# Patient Record
Sex: Male | Born: 1970 | Race: Black or African American | Hispanic: No | Marital: Married | Smoking: Never smoker
Health system: Southern US, Community
[De-identification: ages and names within clinical notes are randomized; demographics above are authoritative.]

## PROBLEM LIST (undated history)

## (undated) DIAGNOSIS — K4090 Unilateral inguinal hernia, without obstruction or gangrene, not specified as recurrent: Secondary | ICD-10-CM

## (undated) DIAGNOSIS — I10 Essential (primary) hypertension: Secondary | ICD-10-CM

---

## 1998-04-26 ENCOUNTER — Emergency Department (HOSPITAL_COMMUNITY): Admission: EM | Admit: 1998-04-26 | Discharge: 1998-04-26 | Payer: Self-pay | Admitting: Internal Medicine

## 1998-04-26 ENCOUNTER — Emergency Department (HOSPITAL_COMMUNITY): Admission: EM | Admit: 1998-04-26 | Discharge: 1998-04-26 | Payer: Self-pay | Admitting: Emergency Medicine

## 1998-04-26 ENCOUNTER — Encounter: Payer: Self-pay | Admitting: Emergency Medicine

## 1998-04-28 ENCOUNTER — Emergency Department (HOSPITAL_COMMUNITY): Admission: EM | Admit: 1998-04-28 | Discharge: 1998-04-28 | Payer: Self-pay

## 2010-03-19 ENCOUNTER — Emergency Department (HOSPITAL_BASED_OUTPATIENT_CLINIC_OR_DEPARTMENT_OTHER): Admission: EM | Admit: 2010-03-19 | Discharge: 2010-03-19 | Payer: Self-pay | Admitting: Emergency Medicine

## 2011-10-17 ENCOUNTER — Ambulatory Visit: Payer: Self-pay

## 2011-10-17 ENCOUNTER — Ambulatory Visit: Payer: Self-pay | Admitting: Emergency Medicine

## 2011-10-17 VITALS — BP 105/72 | HR 70 | Temp 98.8°F | Resp 16 | Ht 71.0 in | Wt 214.0 lb

## 2011-10-17 DIAGNOSIS — M25449 Effusion, unspecified hand: Secondary | ICD-10-CM

## 2011-10-17 DIAGNOSIS — M79646 Pain in unspecified finger(s): Secondary | ICD-10-CM

## 2011-10-17 DIAGNOSIS — S61209A Unspecified open wound of unspecified finger without damage to nail, initial encounter: Secondary | ICD-10-CM

## 2011-10-17 DIAGNOSIS — M79609 Pain in unspecified limb: Secondary | ICD-10-CM

## 2011-10-17 DIAGNOSIS — M79643 Pain in unspecified hand: Secondary | ICD-10-CM

## 2011-10-17 MED ORDER — CEFTRIAXONE SODIUM 1 G IJ SOLR
1.0000 g | Freq: Once | INTRAMUSCULAR | Status: AC
  Administered 2011-10-17: 1 g via INTRAMUSCULAR

## 2011-10-17 MED ORDER — DOXYCYCLINE HYCLATE 100 MG PO TABS: 100.0000 mg | ORAL_TABLET | Freq: Two times a day (BID) | ORAL | Status: AC

## 2011-10-17 NOTE — Progress Notes (Signed)
  Subjective:    Patient ID: Larry Carroll, male    DOB: 05-Dec-1970, 41 y.o.   MRN: [id number]  HPI p denies any trauma to the area. atient enters with a 2 day history of pain and swelling in his right ring finger he tried to mash on the area and since then has developed increasing redness and swelling. He denies trauma to the area.    Review of Systems noncontributory to     Objective:   Physical Exam physical exam reveals swelling involving the entire right index finger extending over the MCP joint. There is a pustule noted over the extensor surface proximal phalanx of the ring finger. This. A material noted beneath the pustule. The swelling extends over the MCP joint to the dorsum of the hand. He has very limited flexion and extension of that finger.  [facility] reading (PRIMARY) by  Dr.Slayter Moorhouse x-ray shows no fracture no acute problems       Assessment & Plan:  Patient is a well of the ring finger right hand. The I&D the area sent for culture give 1 g Rocephin. 48 hours treat doxy 100 twice a day recheck 48 hours

## 2011-10-17 NOTE — Progress Notes (Signed)
   Patient ID: Kevis Qu MRN: [id number], DOB: 1970/09/29, 41 y.o. Date of Encounter: 10/17/2011, 11:37 AM    PROCEDURE NOTE: Verbal consent obtained. Betadine prep per usual protocol. Lesion unroofed with 20 gauge needle  Culture taken. Small amount of purulence expressed. Lesion explored revealing no loculations. Irrigated with [employer]. Wound care instructions including precautions with patient. Patient tolerated the procedure well. Recheck pending labs     Signed, Eula Listen, Cordelia Poche 10/17/2011 11:37 AM

## 2011-10-18 ENCOUNTER — Ambulatory Visit: Payer: Self-pay | Admitting: Emergency Medicine

## 2011-10-18 ENCOUNTER — Encounter: Payer: Self-pay | Admitting: Emergency Medicine

## 2011-10-18 VITALS — BP 116/74 | HR 76 | Temp 98.2°F | Resp 16

## 2011-10-18 DIAGNOSIS — L03019 Cellulitis of unspecified finger: Secondary | ICD-10-CM

## 2011-10-18 NOTE — Progress Notes (Signed)
  Subjective:    Patient ID: Larry Carroll, male    DOB: 11-Aug-1970, 41 y.o.   MRN: [id number]  HPI patient in for wound check of his ring  finger. He denies any fever or chills but he has been taking some ibuprofen. He did note some spontaneous drainage today of his open site on the back of his finger. He has had swelling of his hand extending down to the wrist.    Review of Systems patient has no other ongoing medical problems.     Objective:   Physical Exam there is fusiform swelling of the entire finger. He is able to flex at the PIP joint without discomfort. But he has limited range of motion here. The site is open and draining droplets of purulent material.Swelling extends over the dorsum of the hand which extends down to the wrist.        Assessment & Plan:  Assessment is abscess of the right ring finger. We'll go ahead and have a loose shoulder and of the area more to express more purulent material. We'll go ahead and wrap to give him some compression. He is to clean the area with soap and water 3 or 4 times a day. Otherwise he is to keep the hand elevated

## 2011-10-18 NOTE — Progress Notes (Signed)
[facility]  Marcaine injected locally at base of lesion 2 cc.  Area cleansed.  Pressure applied and purulent drainage was expressed.  Small exudative material removed from central wound.  Peeling tissue debrided. Cleansed and dressed. Pt tolerated this well.

## 2011-10-19 ENCOUNTER — Ambulatory Visit (INDEPENDENT_AMBULATORY_CARE_PROVIDER_SITE_OTHER): Payer: Self-pay | Admitting: Emergency Medicine

## 2011-10-19 VITALS — BP 124/73 | HR 69 | Temp 98.1°F | Resp 16 | Ht 71.0 in | Wt 214.0 lb

## 2011-10-19 DIAGNOSIS — S61209A Unspecified open wound of unspecified finger without damage to nail, initial encounter: Secondary | ICD-10-CM

## 2011-10-19 NOTE — Progress Notes (Signed)
  Subjective:    Patient ID: Larry Carroll, male    DOB: 10/05/1970, 41 y.o.   MRN: [id number]  HPI patient here for wound check. He is doing well. He's had decreased pain and swelling over the I&D site.    Review of Systems     Objective:   Physical Exam physical exam reveals a pustule present over the extensor portion of the proximal phalanx. There is swelling over the finger and dorsum of the hand. He has decreased range of motion at the PIP joint and MCP joints. This is significantly better than when seen yesterday.        Assessment & Plan:  Abscess finger improved on antibiotics. Cultures still pending negative to date we'll recheck 1 week continue doxy for now.

## 2011-10-20 LAB — WOUND CULTURE
Gram Stain: NONE SEEN
Gram Stain: NONE SEEN
Gram Stain: NONE SEEN

## 2011-10-21 LAB — WOUND CULTURE

## 2011-10-27 ENCOUNTER — Ambulatory Visit (INDEPENDENT_AMBULATORY_CARE_PROVIDER_SITE_OTHER): Payer: Self-pay | Admitting: Emergency Medicine

## 2011-10-27 VITALS — BP 106/73 | HR 59 | Temp 97.7°F | Resp 16 | Ht 71.5 in | Wt 214.0 lb

## 2011-10-27 DIAGNOSIS — S61209A Unspecified open wound of unspecified finger without damage to nail, initial encounter: Secondary | ICD-10-CM

## 2011-10-27 DIAGNOSIS — L0291 Cutaneous abscess, unspecified: Secondary | ICD-10-CM

## 2011-10-27 NOTE — Progress Notes (Signed)
  Subjective:    Patient ID: Larry Carroll, male    DOB: 01/24/1971, 41 y.o.   MRN: [id number]  HPI patient here to followup abscess with cellulitis of his hand. He has completed his antibiotics and he feels fine. He has no redness no swelling. He has full range of motion of his finger.    Review of Systems     Objective:   Physical Exam The finger has healed. He has no redness no swelling no evidence of infection.       Assessment & Plan:  Cellulitis of finger resolved. No further treatment necessary .

## 2013-04-13 IMAGING — CR DG FINGER RING 2+V*R*
1 series · 1 of 1 positions shown · non-contrast
Comparison: None.

CLINICAL DATA: Swelling.  No history of trauma provided.

RIGHT RING FINGER 2+V

[PA]
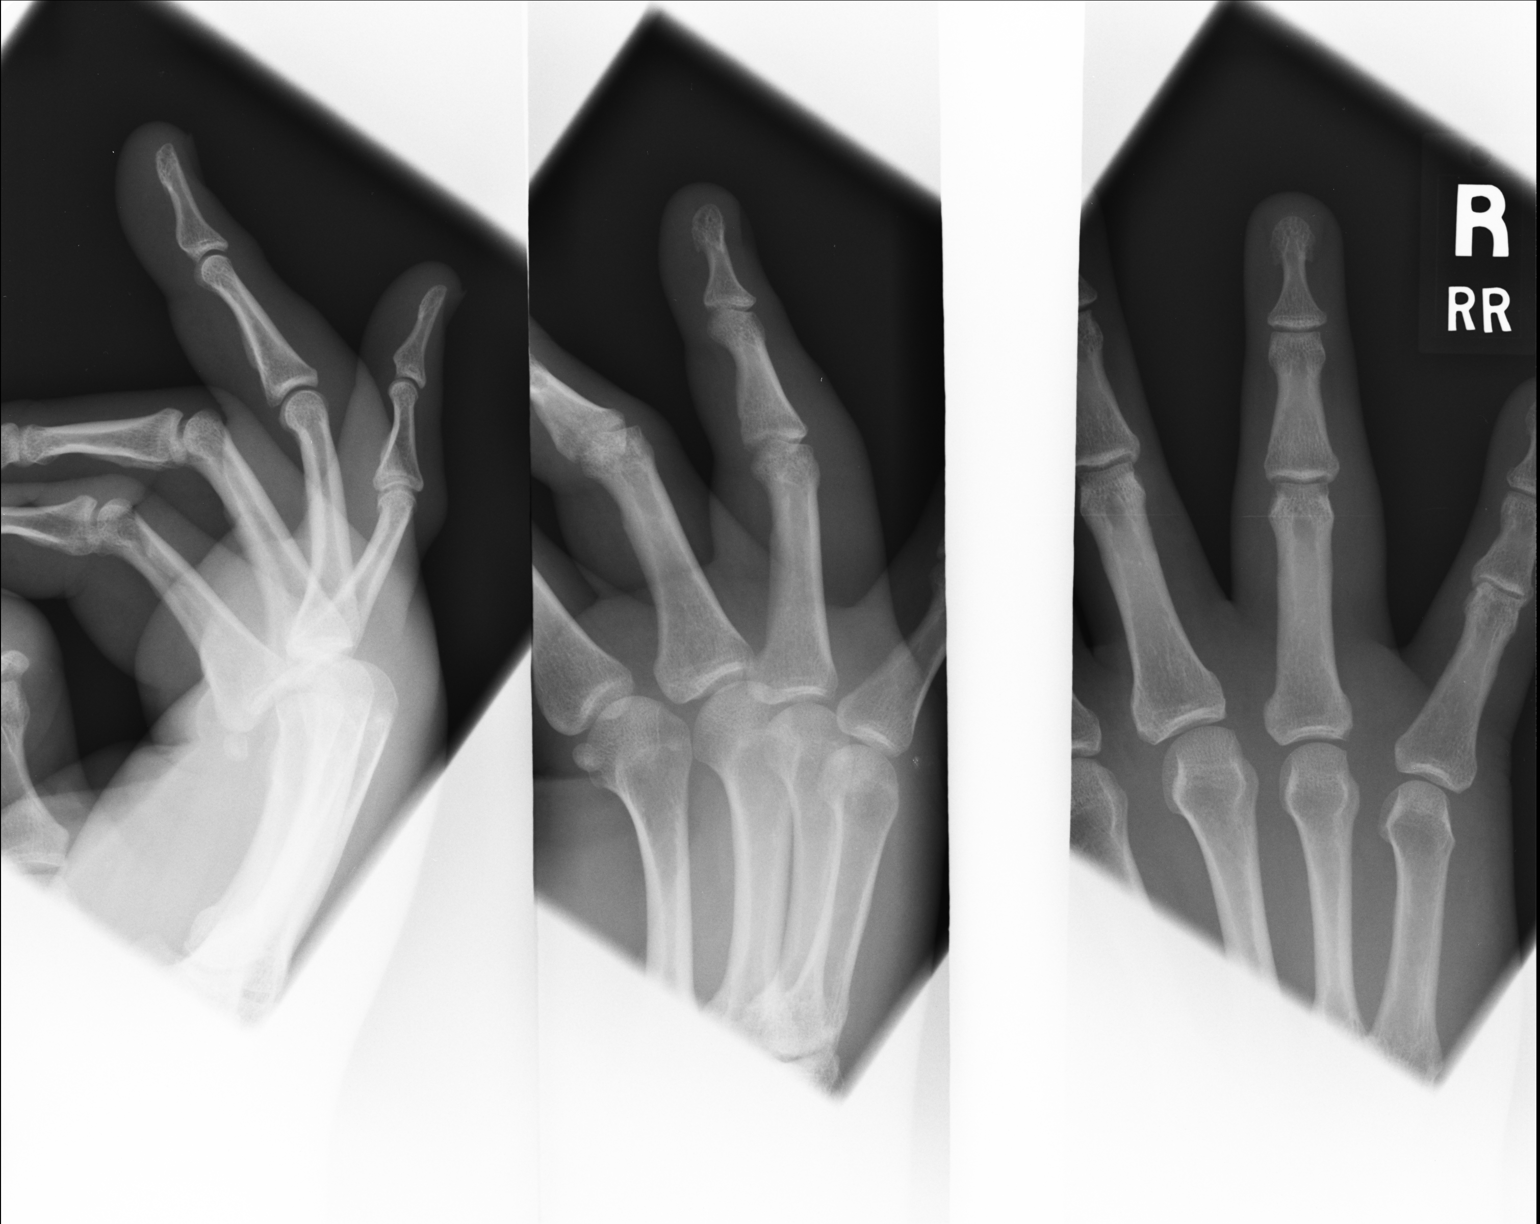

[1 of 1 positions shown; findings below may reference images not displayed]

FINDINGS: No fracture or dislocation.  No bony erosion.  Soft
tissue prominence.
IMPRESSION: No osseous abnormality.

Soft tissue prominence.

Clinically significant discrepancy from primary report, if
provided: None

## 2015-06-08 ENCOUNTER — Ambulatory Visit (INDEPENDENT_AMBULATORY_CARE_PROVIDER_SITE_OTHER): Payer: Self-pay | Admitting: Emergency Medicine

## 2015-06-08 VITALS — BP 138/88 | HR 68 | Temp 97.9°F | Resp 18 | Ht 71.0 in | Wt 237.0 lb

## 2015-06-08 DIAGNOSIS — K4090 Unilateral inguinal hernia, without obstruction or gangrene, not specified as recurrent: Secondary | ICD-10-CM

## 2015-06-08 NOTE — Patient Instructions (Signed)
Hernia, Adult A hernia is the bulging of an organ or tissue through a weak spot in the muscles of the abdomen (abdominal wall). Hernias develop most often near the navel or groin. There are many kinds of hernias. Common kinds include:  Femoral hernia. This kind of hernia develops under the groin in the upper thigh area.  Inguinal hernia. This kind of hernia develops in the groin or scrotum.  Umbilical hernia. This kind of hernia develops near the navel.  Hiatal hernia. This kind of hernia causes part of the stomach to be pushed up into the chest.  Incisional hernia. This kind of hernia bulges through a scar from an abdominal surgery. CAUSES This condition may be caused by:  Heavy lifting.  Coughing over a long period of time.  Straining to have a bowel movement.  An incision made during an abdominal surgery.  A birth defect (congenital defect).  Excess weight or obesity.  Smoking.  Poor nutrition.  Cystic fibrosis.  Excess fluid in the abdomen.  Undescended testicles. SYMPTOMS Symptoms of a hernia include:  A lump on the abdomen. This is the first sign of a hernia. The lump may become more obvious with standing, straining, or coughing. It may get bigger over time if it is not treated or if the condition causing it is not treated.  Pain. A hernia is usually painless, but it may become painful over time if treatment is delayed. The pain is usually dull and may get worse with standing or lifting heavy objects. Sometimes a hernia gets tightly squeezed in the weak spot (strangulated) or stuck there (incarcerated) and causes additional symptoms. These symptoms may include:  Vomiting.  Nausea.  Constipation.  Irritability. DIAGNOSIS A hernia may be diagnosed with:  A physical exam. During the exam your health care provider may ask you to cough or to make a specific movement, because a hernia is usually more visible when you move.  Imaging tests. These can  include:  X-rays.  Ultrasound.  CT scan. TREATMENT A hernia that is small and painless may not need to be treated. A hernia that is large or painful may be treated with surgery. Inguinal hernias may be treated with surgery to prevent incarceration or strangulation. Strangulated hernias are always treated with surgery, because lack of blood to the trapped organ or tissue can cause it to die. Surgery to treat a hernia involves pushing the bulge back into place and repairing the weak part of the abdomen. HOME CARE INSTRUCTIONS  Avoid straining.  Do not lift anything heavier than 10 lb (4.5 kg).  Lift with your leg muscles, not your back muscles. This helps avoid strain.  When coughing, try to cough gently.  Prevent constipation. Constipation leads to straining with bowel movements, which can make a hernia worse or cause a hernia repair to break down. You can prevent constipation by:  Eating a high-fiber diet that includes plenty of fruits and vegetables.  Drinking enough fluids to keep your urine clear or pale yellow. Aim to drink 6-8 glasses of water per day.  Using a stool softener as directed by your health care provider.  Lose weight, if you are overweight.  Do not use any tobacco products, including cigarettes, chewing tobacco, or electronic cigarettes. If you need help quitting, ask your health care provider.  Keep all follow-up visits as directed by your health care provider. This is important. Your health care provider may need to monitor your condition. SEEK MEDICAL CARE IF:  You have   swelling, redness, and pain in the affected area.  Your bowel habits change. SEEK IMMEDIATE MEDICAL CARE IF:  You have a fever.  You have abdominal pain that is getting worse.  You feel nauseous or you vomit.  You cannot push the hernia back in place by gently pressing on it while you are lying down.  The hernia:  Changes in shape or size.  Is stuck outside the  abdomen.  Becomes discolored.  Feels hard or tender.   This information is not intended to replace advice given to you by your health care provider. Make sure you discuss any questions you have with your health care provider.   Document Released: 06/11/2005 Document Revised: 07/02/2014 Document Reviewed: 04/21/2014 Elsevier Interactive Patient Education 2016 [employer]  

## 2015-06-08 NOTE — Progress Notes (Signed)
Subjective:  Patient ID: Larry Carroll, male    DOB: October 20, 1970  Age: 44 y.o. MRN: [id number]  CC: Swelling in inguinal area   HPI Larry RelicRonnie Carroll presents  with recent onset swelling in his right groin. He said these over the last week or 2 he's developed some intermittent pain in his right groin he said the one on Web M.D. and found that he thinks he has a hernia. History of injury. And has no job requiring a lot of lifting or straining. Has no nausea vomiting or stool change.  History Larry Carroll has no past medical history on file.   He has no past surgical history on file.   His  family history is not on file.  He   reports that he has never smoked. He does not have any smokeless tobacco history on file. His alcohol and drug histories are not on file.  Outpatient Prescriptions Prior to Visit  Medication Sig Dispense Refill  . ibuprofen (ADVIL,MOTRIN) 400 MG tablet Take 400 mg by mouth every 6 (six) hours as needed.     No facility-administered medications prior to visit.    Social History   Social History  . Marital Status: Married    Spouse Name: N/A  . Number of Children: N/A  . Years of Education: N/A   Social History Main Topics  . Smoking status: Never Smoker   . Smokeless tobacco: None  . Alcohol Use: None  . Drug Use: None  . Sexual Activity: Not Asked   Other Topics Concern  . None   Social History Narrative     Review of Systems  Constitutional: Negative for fever, chills and appetite change.  HENT: Negative for congestion, ear pain, postnasal drip, sinus pressure and sore throat.   Eyes: Negative for pain and redness.  Respiratory: Negative for cough, shortness of breath and wheezing.   Cardiovascular: Negative for leg swelling.  Gastrointestinal: Negative for nausea, vomiting, abdominal pain, diarrhea, constipation and blood in stool.  Endocrine: Negative for polyuria.  Genitourinary: Negative for dysuria, urgency, frequency and flank pain.    Musculoskeletal: Negative for gait problem.  Skin: Negative for rash.  Neurological: Negative for weakness and headaches.  Psychiatric/Behavioral: Negative for confusion and decreased concentration. The patient is not nervous/anxious.     Objective:  BP 138/88 mmHg  Pulse 68  Temp(Src) 97.9 F (36.6 C) (Oral)  Resp 18  Ht 5\' 11"  (1.803 m)  Wt 237 lb (107.502 kg)  BMI 33.07 kg/m2  SpO2 98%  Physical Exam  Constitutional: He is oriented to person, place, and time. He appears well-developed and well-nourished.  HENT:  Head: Normocephalic and atraumatic.  Eyes: Conjunctivae are normal. Pupils are equal, round, and reactive to light.  Pulmonary/Chest: Effort normal.  Abdominal: A hernia is present. Hernia confirmed positive in the right inguinal area.  Genitourinary: Testes normal and penis normal. Cremasteric reflex is present. Circumcised.  Musculoskeletal: He exhibits no edema.  Neurological: He is alert and oriented to person, place, and time.  Skin: Skin is dry.  Psychiatric: He has a normal mood and affect. His behavior is normal. Thought content normal.      Assessment & Plan:   Larry Carroll was seen today for swelling in inguinal area.  Diagnoses and all orders for this visit:  Unilateral inguinal hernia without obstruction or gangrene, recurrence not specified -     Ambulatory referral to General Surgery  I have discontinued Larry Carroll's ibuprofen.  No orders of the defined types were  placed in this encounter.    Appropriate red flag conditions were discussed with the patient as well as actions that should be taken.  Patient expressed his understanding.  Follow-up: Return if symptoms worsen or fail to improve.  Larry Dane, MD

## 2019-01-22 ENCOUNTER — Telehealth: Payer: Self-pay | Admitting: Family Medicine

## 2019-01-22 NOTE — Telephone Encounter (Signed)
Called pt LVM for him to call us back and schedule NP appt for fever

## 2022-07-21 ENCOUNTER — Emergency Department (HOSPITAL_BASED_OUTPATIENT_CLINIC_OR_DEPARTMENT_OTHER): Payer: BLUE CROSS/BLUE SHIELD

## 2022-07-21 ENCOUNTER — Encounter (HOSPITAL_BASED_OUTPATIENT_CLINIC_OR_DEPARTMENT_OTHER): Payer: Self-pay

## 2022-07-21 DIAGNOSIS — K4020 Bilateral inguinal hernia, without obstruction or gangrene, not specified as recurrent: Secondary | ICD-10-CM | POA: Insufficient documentation

## 2022-07-21 DIAGNOSIS — R102 Pelvic and perineal pain: Secondary | ICD-10-CM | POA: Diagnosis present

## 2022-07-21 LAB — LIPASE, BLOOD: Lipase: 43 U/L (ref 11–51)

## 2022-07-21 LAB — COMPREHENSIVE METABOLIC PANEL
ALT: 35 U/L (ref 0–44)
AST: 30 U/L (ref 15–41)
Albumin: 4.3 g/dL (ref 3.5–5.0)
Alkaline Phosphatase: 54 U/L (ref 38–126)
Anion gap: 8 (ref 5–15)
BUN: 12 mg/dL (ref 6–20)
CO2: 27 mmol/L (ref 22–32)
Calcium: 9.3 mg/dL (ref 8.9–10.3)
Chloride: 102 mmol/L (ref 98–111)
Creatinine, Ser: 1.16 mg/dL (ref 0.61–1.24)
GFR, Estimated: >60 mL/min (ref >60)
Glucose, Bld: 107 mg/dL — ABNORMAL HIGH (ref 70–99)
Potassium: 3.8 mmol/L (ref 3.5–5.1)
Sodium: 137 mmol/L (ref 135–145)
Total Bilirubin: 1.3 mg/dL — ABNORMAL HIGH (ref 0.3–1.2)
Total Protein: 8.5 g/dL — ABNORMAL HIGH (ref 6.5–8.1)

## 2022-07-21 LAB — URINALYSIS, MICROSCOPIC (REFLEX)

## 2022-07-21 LAB — URINALYSIS, ROUTINE W REFLEX MICROSCOPIC
Bilirubin Urine: NEGATIVE
Glucose, UA: NEGATIVE mg/dL
Ketones, ur: NEGATIVE mg/dL
Leukocytes,Ua: NEGATIVE
Nitrite: NEGATIVE
Protein, ur: NEGATIVE mg/dL
Specific Gravity, Urine: 1.02 (ref 1.005–1.030)
pH: 7 (ref 5.0–8.0)

## 2022-07-21 LAB — CBC
HCT: 44.4 % (ref 39.0–52.0)
Hemoglobin: 14.8 g/dL (ref 13.0–17.0)
MCH: 29.5 pg (ref 26.0–34.0)
MCHC: 33.3 g/dL (ref 30.0–36.0)
MCV: 88.6 fL (ref 80.0–100.0)
Platelets: 242 10*3/uL (ref 150–400)
RBC: 5.01 MIL/uL (ref 4.22–5.81)
RDW: 13.2 % (ref 11.5–15.5)
WBC: 8.1 10*3/uL (ref 4.0–10.5)
nRBC: 0 % (ref 0.0–0.2)

## 2022-07-21 MED ORDER — OXYCODONE-ACETAMINOPHEN 5-325 MG PO TABS
1.0000 | ORAL_TABLET | ORAL | Status: DC | PRN
  Administered 2022-07-21: 1 via ORAL
  Filled 2022-07-21: qty 1

## 2022-07-21 NOTE — ED Triage Notes (Addendum)
Pt reports a dx inguinal hernia on the RT that is larger today and painful. Pt states he normally wears his hernia belt, but it is not helping today. Pt states the hernia feels very hard. Pt denies urinary sx. Pt worried about strangulation of hernia.

## 2022-07-22 ENCOUNTER — Emergency Department (HOSPITAL_BASED_OUTPATIENT_CLINIC_OR_DEPARTMENT_OTHER)
Admission: EM | Admit: 2022-07-22 | Discharge: 2022-07-22 | Disposition: A | Discharge status: Home / Self Care | Payer: BLUE CROSS/BLUE SHIELD | Attending: Emergency Medicine | Admitting: Emergency Medicine

## 2022-07-22 ENCOUNTER — Other Ambulatory Visit: Payer: Self-pay

## 2022-07-22 DIAGNOSIS — K4020 Bilateral inguinal hernia, without obstruction or gangrene, not specified as recurrent: Secondary | ICD-10-CM

## 2022-07-22 HISTORY — DX: Essential (primary) hypertension: I10

## 2022-07-22 HISTORY — DX: Unilateral inguinal hernia, without obstruction or gangrene, not specified as recurrent: K40.90

## 2022-07-22 MED ORDER — DICLOFENAC SODIUM ER 100 MG PO TB24: 100.0000 mg | ORAL_TABLET | Freq: Every day | ORAL | 0 refills | Status: AC

## 2022-07-22 MED ORDER — KETOROLAC TROMETHAMINE 60 MG/2ML IM SOLN
30.0000 mg | Freq: Once | INTRAMUSCULAR | Status: AC
  Administered 2022-07-22: 30 mg via INTRAMUSCULAR
  Filled 2022-07-22: qty 2

## 2022-07-22 MED ORDER — DICLOFENAC SODIUM ER 100 MG PO TB24: 100.0000 mg | ORAL_TABLET | Freq: Every day | ORAL | 0 refills | Status: DC

## 2022-07-22 NOTE — ED Provider Notes (Signed)
[facility] Provider Note   CSN: [id number] Arrival date & time: 07/21/22  1812     History  Chief Complaint  Patient presents with   Pelvic Pain    Larry Carroll is a 52 y.o. male.  The history is provided by the patient.  Pelvic Pain This is a recurrent problem. The current episode started 6 to 12 hours ago. The problem occurs constantly. Pertinent negatives include no chest pain, no headaches and no shortness of breath. Nothing aggravates the symptoms. Nothing relieves the symptoms. He has tried nothing for the symptoms. The treatment provided no relief.  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  Patient knows he has hernias and usually wears a belt that helps but it did not help.       Home Medications Prior to Admission medications   Medication Sig Start Date End Date Taking? Authorizing Provider  Diclofenac Sodium CR 100 MG 24 hr tablet Take 1 tablet (100 mg total) by mouth daily. 07/22/22  Yes Yoltzin Ransom, MD      Allergies    Patient has no known allergies.    Review of Systems   Review of Systems  Constitutional:  Negative for fever.  HENT:  Negative for ear pain.   Eyes:  Negative for redness.  Respiratory:  Negative for shortness of breath.   Cardiovascular:  Negative for chest pain.  Genitourinary:  Positive for pelvic pain.       Groin/pelvic pain   Neurological:  Negative for headaches.  All other systems reviewed and are negative.   Physical Exam Updated Vital Signs BP (!) 143/103 (BP Location: Left Arm)   Pulse 77   Temp 98.5 F (36.9 C) (Oral)   Resp 18   Ht 5\' 11"  (1.803 m)   Wt 99.8 kg   SpO2 99%   BMI 30.68 kg/m  Physical Exam Vitals and nursing note reviewed.  Constitutional:      General: He is not in acute distress.    Appearance: Normal appearance. He is well-developed. He is not diaphoretic.  HENT:     Head: Normocephalic  and atraumatic.     Right Ear: Tympanic membrane normal.     Left Ear: Tympanic membrane normal.     Nose: No congestion.  Eyes:     Conjunctiva/sclera: Conjunctivae normal.     Pupils: Pupils are equal, round, and reactive to light.  Cardiovascular:     Rate and Rhythm: Normal rate and regular rhythm.     Pulses: Normal pulses.     Heart sounds: Normal heart sounds.  Pulmonary:     Effort: Pulmonary effort is normal.     Breath sounds: Normal breath sounds. No wheezing or rales.  Abdominal:     General: Bowel sounds are normal.     Palpations: Abdomen is soft. There is no mass.     Tenderness: There is no abdominal tenderness. There is no guarding or rebound.  Musculoskeletal:        General: Normal range of motion.     Cervical back: Normal range of motion and neck supple.  Skin:    General: Skin is warm and dry.     Capillary Refill: Capillary refill takes less than 2 seconds.  Neurological:     General: No focal deficit present.     Mental Status: He is alert and oriented to person, place, and time.  Deep Tendon Reflexes: Reflexes normal.  Psychiatric:        Mood and Affect: Mood normal.        Behavior: Behavior normal.     ED Results / Procedures / Treatments   Labs (all labs ordered are listed, but only abnormal results are displayed) Results for orders placed or performed during the hospital encounter of 07/22/22  Lipase, blood  Result Value Ref Range   Lipase 43 11 - 51 U/L  Comprehensive metabolic panel  Result Value Ref Range   Sodium 137 135 - 145 mmol/L   Potassium 3.8 3.5 - 5.1 mmol/L   Chloride 102 98 - 111 mmol/L   CO2 27 22 - 32 mmol/L   Glucose, Bld 107 (H) 70 - 99 mg/dL   BUN 12 6 - 20 mg/dL   Creatinine, Ser 5.40 0.61 - 1.24 mg/dL   Calcium 9.3 8.9 - 98.1 mg/dL   Total Protein 8.5 (H) 6.5 - 8.1 g/dL   Albumin 4.3 3.5 - 5.0 g/dL   AST 30 15 - 41 U/L   ALT 35 0 - 44 U/L   Alkaline Phosphatase 54 38 - 126 U/L   Total Bilirubin 1.3 (H) 0.3 -  1.2 mg/dL   GFR, Estimated >19 >14 mL/min   Anion gap 8 5 - 15  CBC  Result Value Ref Range   WBC 8.1 4.0 - 10.5 K/uL   RBC 5.01 4.22 - 5.81 MIL/uL   Hemoglobin 14.8 13.0 - 17.0 g/dL   HCT 78.2 95.6 - 21.3 %   MCV 88.6 80.0 - 100.0 fL   MCH 29.5 26.0 - 34.0 pg   MCHC 33.3 30.0 - 36.0 g/dL   RDW 08.6 57.8 - 46.9 %   Platelets 242 150 - 400 K/uL   nRBC 0.0 0.0 - 0.2 %  Urinalysis, Routine w reflex microscopic -Urine, Clean Catch  Result Value Ref Range   Color, Urine YELLOW YELLOW   APPearance CLEAR CLEAR   Specific Gravity, Urine 1.020 1.005 - 1.030   pH 7.0 5.0 - 8.0   Glucose, UA NEGATIVE NEGATIVE mg/dL   Hgb urine dipstick TRACE (A) NEGATIVE   Bilirubin Urine NEGATIVE NEGATIVE   Ketones, ur NEGATIVE NEGATIVE mg/dL   Protein, ur NEGATIVE NEGATIVE mg/dL   Nitrite NEGATIVE NEGATIVE   Leukocytes,Ua NEGATIVE NEGATIVE  Urinalysis, Microscopic (reflex)  Result Value Ref Range   RBC / HPF 0-5 0 - 5 RBC/hpf   WBC, UA 0-5 0 - 5 WBC/hpf   Bacteria, UA RARE (A) NONE SEEN   Squamous Epithelial / HPF 0-5 0 - 5 /HPF   CT Renal Stone Study  Result Date: 07/21/2022 CLINICAL DATA:  Flank pain and history of right inguinal hernia, initial encounter EXAM: CT ABDOMEN AND PELVIS WITHOUT CONTRAST TECHNIQUE: Multidetector CT imaging of the abdomen and pelvis was performed following the standard protocol without IV contrast. RADIATION DOSE REDUCTION: This exam was performed according to the departmental dose-optimization program which includes automated exposure control, adjustment of the mA and/or kV according to patient size and/or use of iterative reconstruction technique. COMPARISON:  None Available. FINDINGS: Lower chest: No acute abnormality. Hepatobiliary: No focal liver abnormality is seen. No gallstones, gallbladder wall thickening, or biliary dilatation. Pancreas: Unremarkable. No pancreatic ductal dilatation or surrounding inflammatory changes. Spleen: Normal in size without focal  abnormality. Adrenals/Urinary Tract: Adrenal glands are within normal limits. Kidneys are well visualized bilaterally. No renal calculi or obstructive changes are seen. Scattered small hypodense lesions are noted consistent with  small cysts. No further follow-up is recommended. No obstructive changes are seen. The bladder is decompressed. Stomach/Bowel: Diverticular change of the colon is noted without evidence of diverticulitis. No obstructive changes of the colon are seen. The appendix is within normal limits. Small bowel and stomach are unremarkable. Vascular/Lymphatic: No significant vascular findings are present. No enlarged abdominal or pelvic lymph nodes. Reproductive: Prostate is unremarkable. Other: Bilateral fat containing inguinal hernias are noted worse on the right than the left. No bowel is noted within the hernia. No inflammatory changes are seen. No free fluid is noted. Musculoskeletal: No acute or significant osseous findings. IMPRESSION: Bilateral fat containing inguinal hernias right greater than left. No significant inflammatory changes or bowel incarceration are seen. Diverticulosis without diverticulitis. No other focal abnormality is noted. Electronically Signed   By: Alcide Clever M.D.   On: 07/21/2022 23:55    Radiology CT Renal Stone Study  Result Date: 07/21/2022 CLINICAL DATA:  Flank pain and history of right inguinal hernia, initial encounter EXAM: CT ABDOMEN AND PELVIS WITHOUT CONTRAST TECHNIQUE: Multidetector CT imaging of the abdomen and pelvis was performed following the standard protocol without IV contrast. RADIATION DOSE REDUCTION: This exam was performed according to the departmental dose-optimization program which includes automated exposure control, adjustment of the mA and/or kV according to patient size and/or use of iterative reconstruction technique. COMPARISON:  None Available. FINDINGS: Lower chest: No acute abnormality. Hepatobiliary: No focal liver abnormality is  seen. No gallstones, gallbladder wall thickening, or biliary dilatation. Pancreas: Unremarkable. No pancreatic ductal dilatation or surrounding inflammatory changes. Spleen: Normal in size without focal abnormality. Adrenals/Urinary Tract: Adrenal glands are within normal limits. Kidneys are well visualized bilaterally. No renal calculi or obstructive changes are seen. Scattered small hypodense lesions are noted consistent with small cysts. No further follow-up is recommended. No obstructive changes are seen. The bladder is decompressed. Stomach/Bowel: Diverticular change of the colon is noted without evidence of diverticulitis. No obstructive changes of the colon are seen. The appendix is within normal limits. Small bowel and stomach are unremarkable. Vascular/Lymphatic: No significant vascular findings are present. No enlarged abdominal or pelvic lymph nodes. Reproductive: Prostate is unremarkable. Other: Bilateral fat containing inguinal hernias are noted worse on the right than the left. No bowel is noted within the hernia. No inflammatory changes are seen. No free fluid is noted. Musculoskeletal: No acute or significant osseous findings. IMPRESSION: Bilateral fat containing inguinal hernias right greater than left. No significant inflammatory changes or bowel incarceration are seen. Diverticulosis without diverticulitis. No other focal abnormality is noted. Electronically Signed   By: Alcide Clever M.D.   On: 07/21/2022 23:55    Procedures Procedures    Medications Ordered in ED Medications  oxyCODONE-acetaminophen (PERCOCET/ROXICET) 5-325 MG per tablet 1 tablet (1 tablet Oral Given 07/21/22 1953)  ketorolac (TORADOL) injection 30 mg (30 mg Intramuscular Given 07/22/22 0154)    ED Course/ Medical Decision Making/ A&P                             Medical Decision Making Patient with groin pain post preaching in church   Amount and/or Complexity of Data Reviewed Independent Historian: spouse     Details: See above  External Data Reviewed: notes.    Details: Previous notes reviewed  Labs: ordered.    Details: All labs reviewed: normal lipase 43, normal urine without UTI.  Normal white count 8.1, normal hemoglobin 14.8 normal platelets.  Normal sodium 137, normal  potassium 3.8  Radiology: ordered and independent interpretation performed.    Details: Fat containing non obstructed inguinal hernias   Risk Prescription drug management. Risk Details: Patient with known inguinal hernias,  fat containing,  no obstruction.  Would start stool softeners to decrease abdominal pressure.  Will start NSAIDs and refer to surgery to discuss elective repair of hernias.  Stable for discharge.  Strict return.      Final Clinical Impression(s) / ED Diagnoses Final diagnoses:  Bilateral inguinal hernia without obstruction or gangrene, recurrence not specified  Return for intractable cough, coughing up blood, fevers > 100.4 unrelieved by medication, shortness of breath, intractable vomiting, chest pain, shortness of breath, weakness, numbness, changes in speech, facial asymmetry, abdominal pain, passing out, Inability to tolerate liquids or food, cough, altered mental status or any concerns. No signs of systemic illness or infection. The patient is nontoxic-appearing on exam and vital signs are within normal limits.  I have reviewed the triage vital signs and the nursing notes. Pertinent labs & imaging results that were available during my care of the patient were reviewed by me and considered in my medical decision making (see chart for details). After history, exam, and medical workup I feel the patient has been appropriately medically screened and is safe for discharge home. Pertinent diagnoses were discussed with the patient. Patient was given return precautions.  Rx / DC Orders ED Discharge Orders          Ordered    Diclofenac Sodium CR 100 MG 24 hr tablet  Daily        07/22/22 0139               Anglea Gordner, MD 07/22/22 0202

## 2022-07-23 LAB — URINE CULTURE
Culture: NO GROWTH
Special Requests: NORMAL
